# Patient Record
Sex: Male | Born: 1990 | Race: White | Hispanic: No | Marital: Single | Smoking: Never smoker
Health system: Southern US, Community
[De-identification: ages and names within clinical notes are randomized; demographics above are authoritative.]

## PROBLEM LIST (undated history)

## (undated) DIAGNOSIS — F909 Attention-deficit hyperactivity disorder, unspecified type: Secondary | ICD-10-CM

## (undated) HISTORY — PX: ANKLE SURGERY: SHX546

---
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
## 2012-05-07 ENCOUNTER — Other Ambulatory Visit: Payer: Self-pay | Admitting: Family Medicine

## 2012-05-07 DIAGNOSIS — N498 Inflammatory disorders of other specified male genital organs: Secondary | ICD-10-CM

## 2012-06-11 ENCOUNTER — Other Ambulatory Visit: Payer: Self-pay

## 2012-09-08 ENCOUNTER — Ambulatory Visit
Admission: RE | Admit: 2012-09-08 | Discharge: 2012-09-08 | Disposition: A | Discharge status: Home / Self Care | Payer: 59 | Source: Ambulatory Visit | Attending: Family Medicine | Admitting: Family Medicine

## 2012-09-08 DIAGNOSIS — N498 Inflammatory disorders of other specified male genital organs: Secondary | ICD-10-CM

## 2013-08-02 IMAGING — US US SCROTUM
1 series · 14 of 25 positions shown · non-contrast
Comparison: None.

CLINICAL DATA: Extratesticular nodule felt in the right scrotum.

ULTRASOUND OF SCROTUM
TECHNIQUE: Complete ultrasound examination of the testicles,
epididymis, and other scrotal structures was performed.

[Series 1: us scrotum · 0.08mm/px · 14 of 40 slices shown]
[im 1/40]
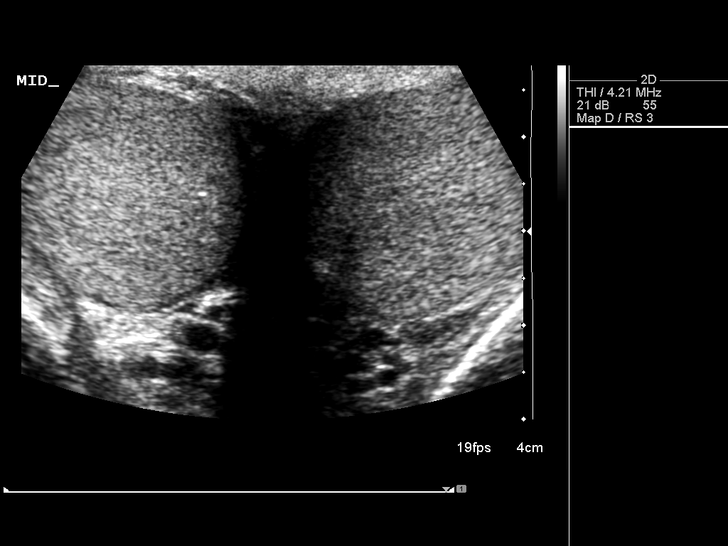
[im 4/40]
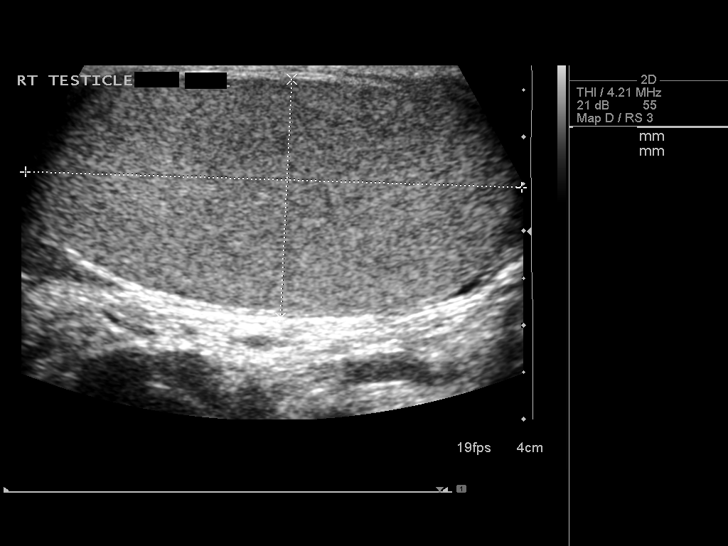
[im 7/40]
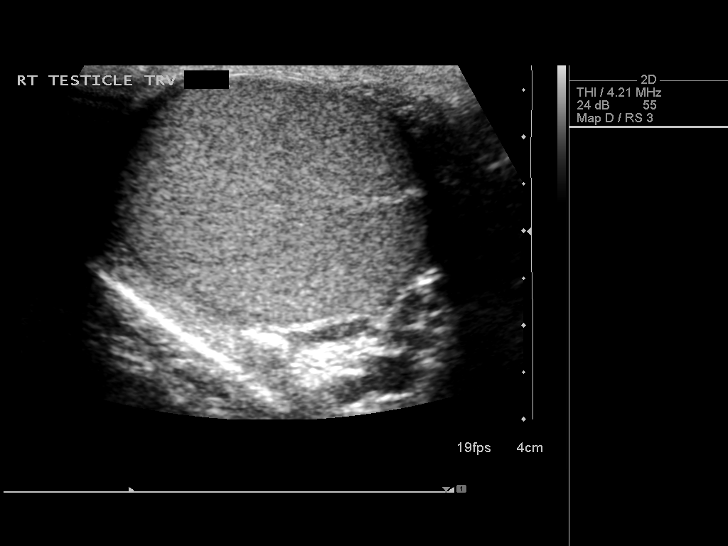
[im 10/40]
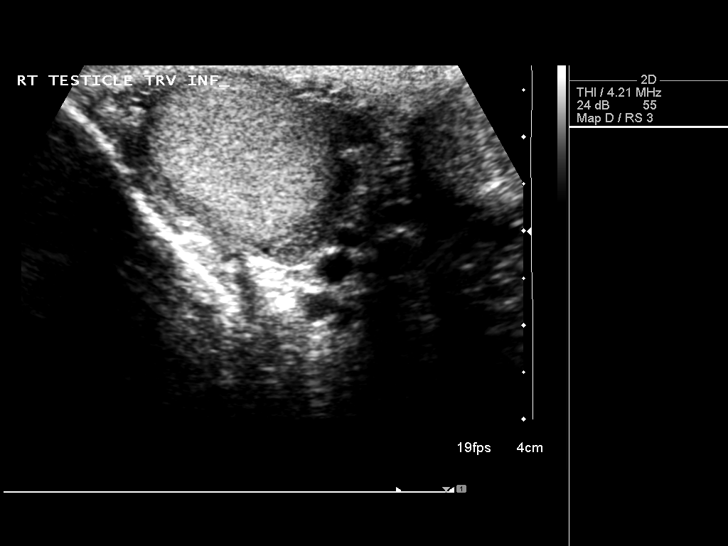
[im 14/40]
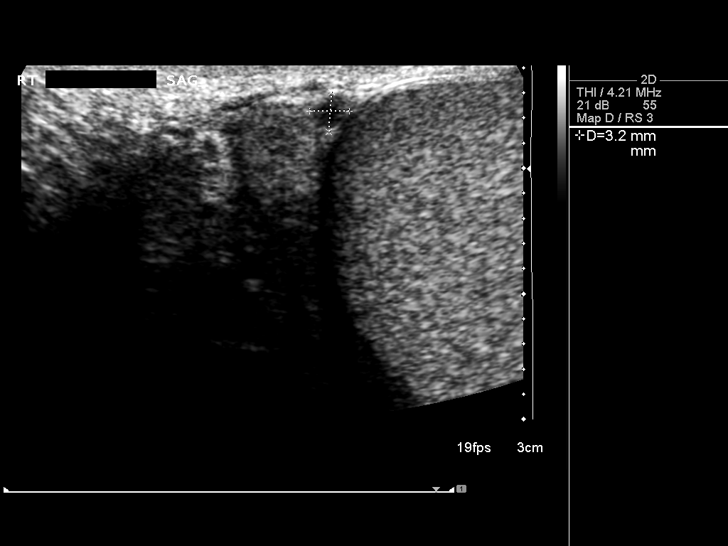
[im 15/40]
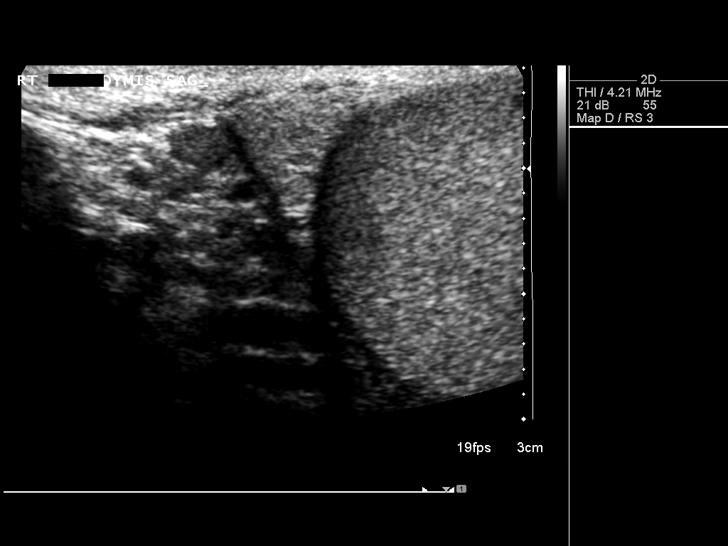
[im 18/40]
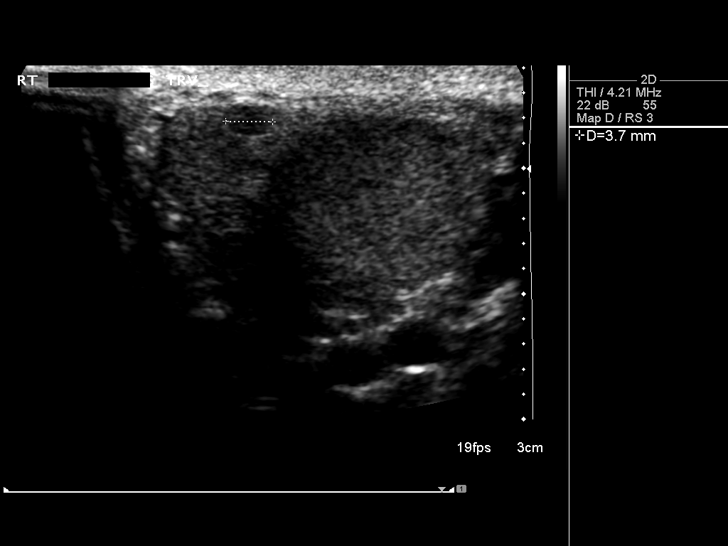
[im 22/40]
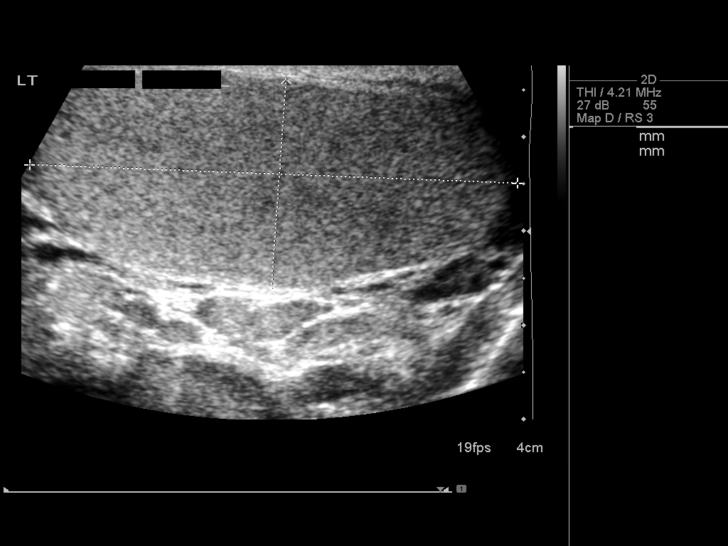
[im 25/40]
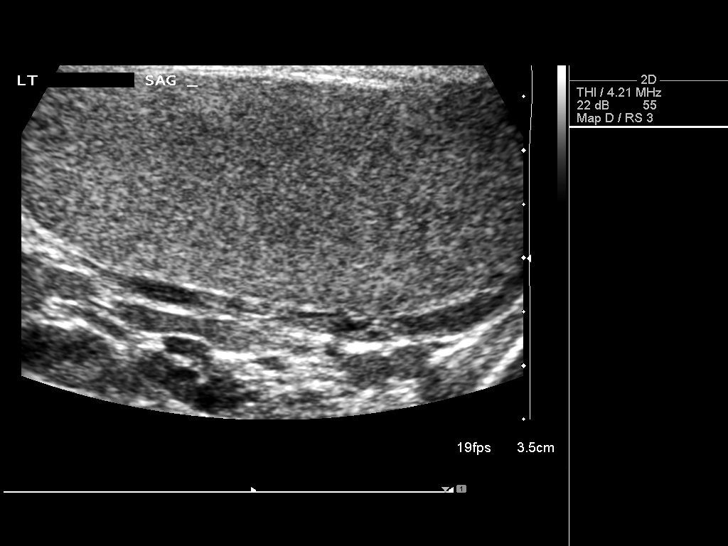
[im 27/40]
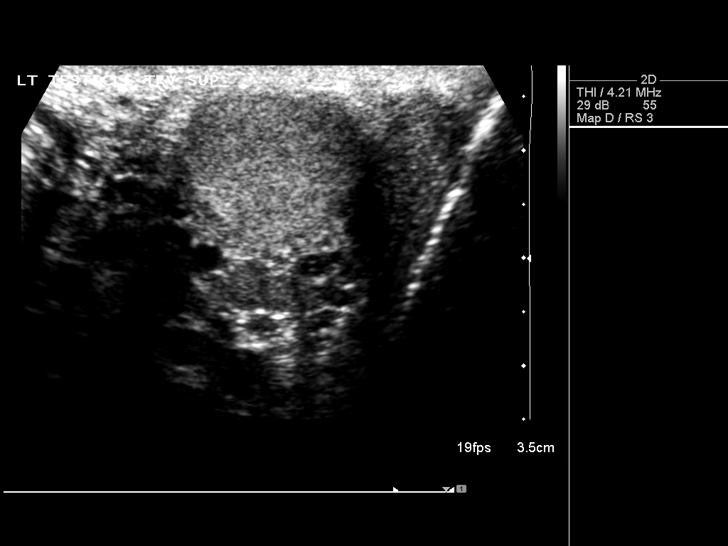
[im 30/40]
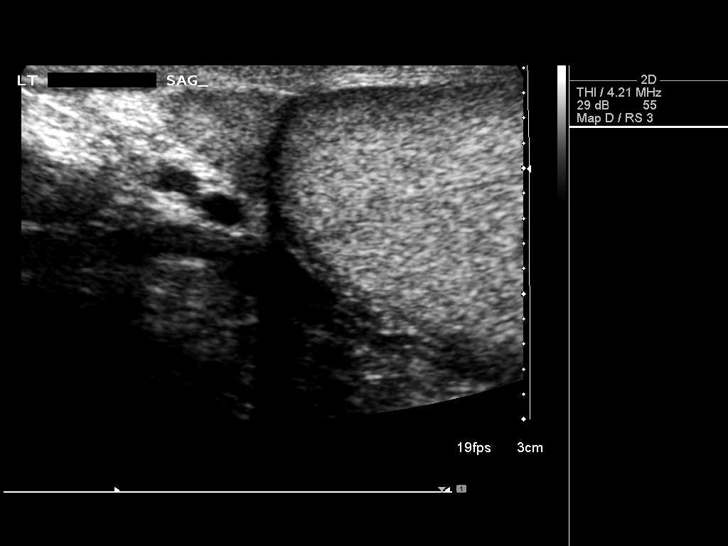
[im 33/40]
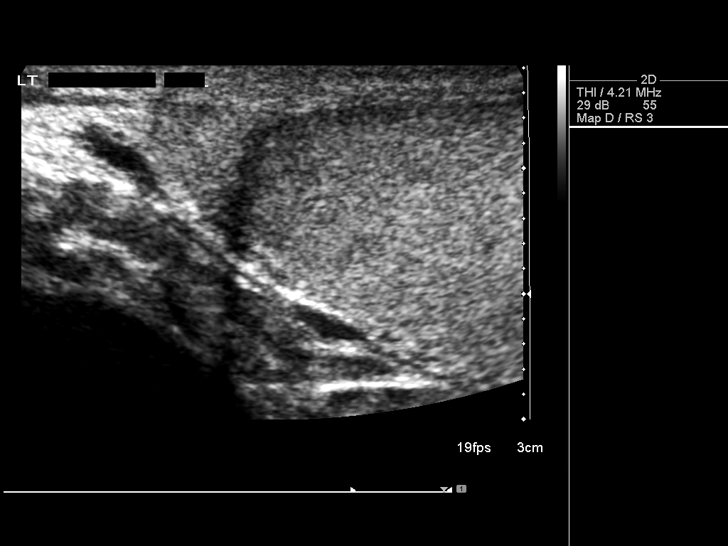
[im 36/40]
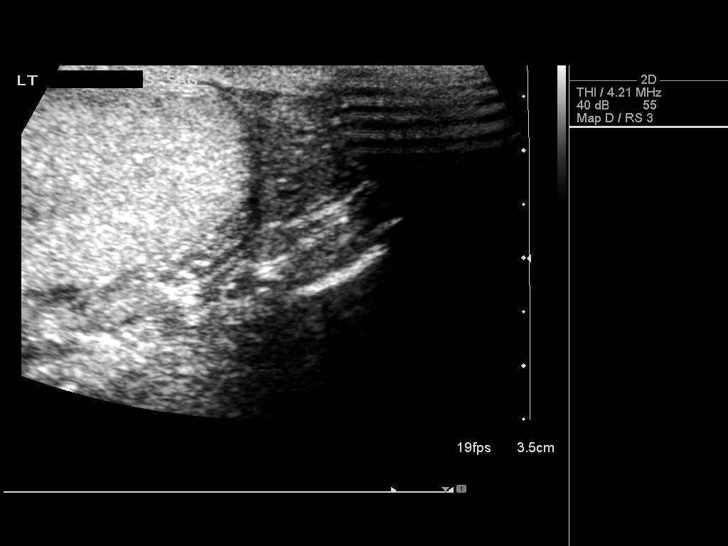
[im 40/40]
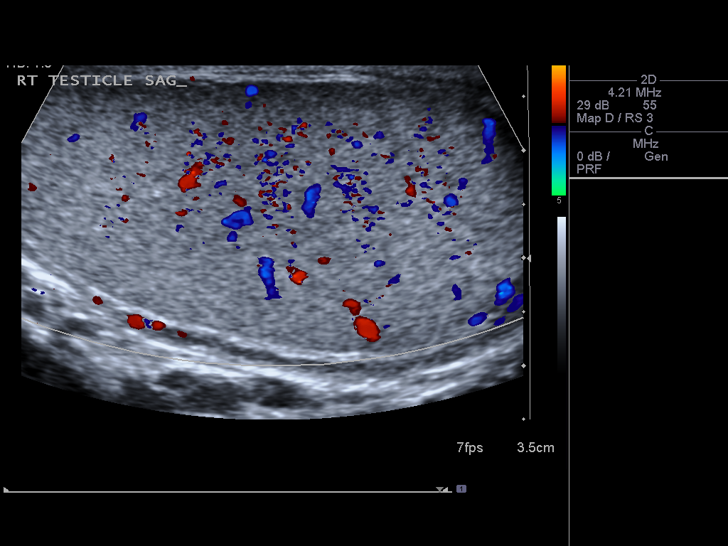

[14 of 25 positions shown; findings below may reference images not displayed]

FINDINGS: Right testis:  5.3 x 2.5 x 3.4 cm.  Homogeneous echotexture with
normal blood flow on color Doppler imaging.  No intratesticular
mass lesion.

Left testis:  5.2 x 2.3 x 3.1 cm.  Also with normal ultrasound
features.

Right epididymis:  Normal size and configuration.  Tiny 4 mm
epididymal cyst or spermatocele evident.

Left epididymis:  Normal in size and appearance.

Hydrocele:  Absent.

Varicocele:  Absent.
IMPRESSION: Tiny 4 mm right epididymal cyst or spermatocele.  Otherwise normal
exam.

## 2015-09-17 ENCOUNTER — Ambulatory Visit
Admission: EM | Admit: 2015-09-17 | Discharge: 2015-09-17 | Disposition: A | Discharge status: Home / Self Care | Payer: 59 | Attending: Family Medicine | Admitting: Family Medicine

## 2015-09-17 DIAGNOSIS — H6592 Unspecified nonsuppurative otitis media, left ear: Secondary | ICD-10-CM

## 2015-09-17 DIAGNOSIS — H93232 Hyperacusis, left ear: Secondary | ICD-10-CM

## 2015-09-17 MED ORDER — AMOXICILLIN 875 MG PO TABS: 875.0000 mg | ORAL_TABLET | Freq: Two times a day (BID) | ORAL | Status: AC

## 2015-09-17 NOTE — ED Provider Notes (Signed)
CSN: [id number]     Arrival date & time 09/17/15  [id number] History   First MD Initiated Contact with Patient 09/17/15 1100     Chief Complaint  Patient presents with  . Hearing Loss   (Consider location/radiation/quality/duration/timing/severity/associated sxs/prior Treatment) HPI Comments: 25 yo male with a 1 week h/o left sided hearing loss. Patient reports fullness feeling of left ear. States about 4 months ago was at a fair around loud noises and for 3 days after that had ringing in the left ear which resolved. About 1 week ago was cleaning ear canal with a Q tip and felt sudden pain. Since then has noticed the fullness feeling and hearing loss. Denies any fevers, chills, drainage.   The history is provided by the patient.    No past medical history on file. No past surgical history on file. No family history on file. Social History  Substance Use Topics  . Smoking status: Never Smoker   . Smokeless tobacco: None  . Alcohol Use: No    Review of Systems  Allergies  Review of patient's allergies indicates no known allergies.  Home Medications   Prior to Admission medications   Medication Sig Start Date End Date Taking? Authorizing Provider  amphetamine-dextroamphetamine (ADDERALL) 20 MG tablet Take 20 mg by mouth 2 (two) times daily.   Yes Historical Provider, MD  amoxicillin (AMOXIL) 875 MG tablet Take 1 tablet (875 mg total) by mouth 2 (two) times daily. 09/17/15   Payton Mccallumrlando Tesslyn Baumert, MD   Meds Ordered and Administered this Visit  Medications - No data to display  BP 111/71 mmHg  Pulse 66  Temp(Src) 97.4 F (36.3 C) (Oral)  Resp 16  Ht 5\' 11"  (1.803 m)  Wt 175 lb (79.379 kg)  BMI 24.42 kg/m2  SpO2 100% No data found.   Physical Exam  Constitutional: He appears well-developed and well-nourished. No distress.  HENT:  Head: Normocephalic and atraumatic.  Right Ear: Tympanic membrane, external ear and ear canal normal.  Left Ear: External ear and ear canal normal. A  middle ear effusion is present.  Nose: Nose normal.  Mouth/Throat: Uvula is midline, oropharynx is clear and moist and mucous membranes are normal. No oropharyngeal exudate or tonsillar abscesses.  Eyes: Conjunctivae and EOM are normal. Pupils are equal, round, and reactive to light. Right eye exhibits no discharge. Left eye exhibits no discharge. No scleral icterus.  Neck: Normal range of motion. Neck supple. No tracheal deviation present. No thyromegaly present.  Cardiovascular: Normal rate, regular rhythm and normal heart sounds.   Pulmonary/Chest: Effort normal and breath sounds normal. No stridor. No respiratory distress. He has no wheezes. He has no rales. He exhibits no tenderness.  Lymphadenopathy:    He has no cervical adenopathy.  Neurological: He is alert.  Skin: Skin is warm and dry. No rash noted. He is not diaphoretic.  Nursing note and vitals reviewed.   ED Course  Procedures (including critical care time)  Labs Review Labs Reviewed - No data to display  Imaging Review No results found.   Visual Acuity Review  Right Eye Distance:   Left Eye Distance:   Bilateral Distance:    Right Eye Near:   Left Eye Near:    Bilateral Near:         MDM   1. Left serous otitis media, recurrence not specified, unspecified chronicity   2. Hearing abnormally acute, left    Discharge Medication List as of 09/17/2015 11:33 AM    START taking  these medications   Details  amoxicillin (AMOXIL) 875 MG tablet Take 1 tablet (875 mg total) by mouth 2 (two) times daily., Starting 09/17/2015, Until Discontinued, Normal       1. diagnosis reviewed with patient 2. rx as per orders above; reviewed possible side effects, interactions, risks and benefits  3. Follow-up with ENT if no improvement after treatment or sooner prn if symptoms worsen or don't improve    Payton Mccallum, MD 09/18/15 [phone]

## 2015-09-17 NOTE — ED Notes (Signed)
Sudden since this morning. Pt reports it caused him to miss his alarm. Reports sounds are muffled. Has had tinnitus for 3 months. Also cleaned his ear about 1 week ago with a Q Tip and possibly damaged his TM.

## 2017-10-06 ENCOUNTER — Other Ambulatory Visit: Payer: Self-pay

## 2017-10-06 ENCOUNTER — Emergency Department (HOSPITAL_BASED_OUTPATIENT_CLINIC_OR_DEPARTMENT_OTHER)
Admission: EM | Admit: 2017-10-06 | Discharge: 2017-10-06 | Disposition: A | Discharge status: Home / Self Care | Payer: 59 | Attending: Emergency Medicine | Admitting: Emergency Medicine

## 2017-10-06 ENCOUNTER — Encounter (HOSPITAL_BASED_OUTPATIENT_CLINIC_OR_DEPARTMENT_OTHER): Payer: Self-pay | Admitting: *Deleted

## 2017-10-06 DIAGNOSIS — Y929 Unspecified place or not applicable: Secondary | ICD-10-CM | POA: Insufficient documentation

## 2017-10-06 DIAGNOSIS — S0101XA Laceration without foreign body of scalp, initial encounter: Secondary | ICD-10-CM | POA: Insufficient documentation

## 2017-10-06 DIAGNOSIS — Z79899 Other long term (current) drug therapy: Secondary | ICD-10-CM | POA: Insufficient documentation

## 2017-10-06 DIAGNOSIS — Y998 Other external cause status: Secondary | ICD-10-CM | POA: Insufficient documentation

## 2017-10-06 DIAGNOSIS — W19XXXA Unspecified fall, initial encounter: Secondary | ICD-10-CM

## 2017-10-06 DIAGNOSIS — W01198A Fall on same level from slipping, tripping and stumbling with subsequent striking against other object, initial encounter: Secondary | ICD-10-CM | POA: Insufficient documentation

## 2017-10-06 DIAGNOSIS — Y9301 Activity, walking, marching and hiking: Secondary | ICD-10-CM | POA: Insufficient documentation

## 2017-10-06 HISTORY — DX: Attention-deficit hyperactivity disorder, unspecified type: F90.9

## 2017-10-06 MED ORDER — ACETAMINOPHEN 325 MG PO TABS
650.0000 mg | ORAL_TABLET | Freq: Once | ORAL | Status: AC
  Administered 2017-10-06: 650 mg via ORAL
  Filled 2017-10-06: qty 2

## 2017-10-06 NOTE — Discharge Instructions (Signed)
Please see the information and instructions below regarding your visit.  Your diagnoses today include:  1. Laceration of scalp without foreign body, initial encounter   2. Fall from standing, initial encounter    Concussions are caused by acceleration/deceleration forces of the brain against the skull and in mild forms, cannot be seen on any imaging. The injury occurs at the microscopic level, and causes a disturbance more in function than the structure of the brain itself. Common symptoms of concussion include: ?Poor coordination such as stumbling or inability to walk in a straight line ?Vacant stare (befuddled facial expression) ?Delayed verbal expression (slower to answer questions or follow instructions) ?Inability to focus attention (easily distracted and unable to follow through with normal activities) ?Disorientation (walking in the wrong direction, unaware of time, date, place) ?Slurred or incoherent speech (making disjointed or incomprehensible statements) ?Emotionality out of proportion to circumstances (appearing distraught, crying for no apparent reason) ?Memory deficits (exhibited by patient repeatedly asking the same question that has already been answered or inability to recall three of three words after five minutes)  Tests performed today include:  See side panel of your discharge paperwork for testing performed today. Vital signs are listed at the bottom of these instructions.   Medications prescribed:    Avoid aspirin. Take only ibuprofen (Advil) or naproxen (Aleve), and acetaminophen (Tylenol).  Take any prescribed medications only as prescribed, and any over the counter medications only as directed on the packaging.  Home care instructions:  Please follow any educational materials contained in this packet.    Do not take tranquilizers, sedatives, narcotics or alcohol  Use ice packs for comfort  For the first 24 hours, do not soak the head.  You may shower after 24  hours, but be careful not to scrub the area where the staples are in place.  Follow-up instructions: Please follow-up with your primary care provider for further evaluation of your symptoms if they are not completely improved.   Please follow-up with this department or urgent care facility in 7 days for staple removal.  Return instructions:  Please return to the Emergency Department if you experience worsening symptoms.   If any of the following occur notify your physician or go to the Hospital Emergency Department:  Increased drowsiness, confusion, or loss of consciousness  Restlessness or convulsions (fits)  Paralysis in arms or legs  Temperature above 100 F  Vomiting  Severe headache  Blood or clear fluid dripping from the nose or ears  Stiffness of the neck  Dizziness or blurred vision  Pulsating pain in the eye  Unequal pupils of eye  Personality changes  Any other unusual symptoms  Please return if you have any other emergent concerns.   Please return the emergency department if you notice any drainage is green or yellow out of the wound, or redness surrounding the wound.  Additional Information:   Your vital signs today were: BP 118/68 (BP Location: Right Arm)    Pulse 78    Temp 98.2 F (36.8 C) (Oral)    Resp 18    Ht 5\' 11"  (1.803 m)    Wt 74.8 kg (165 lb)    SpO2 98%    BMI 23.01 kg/m  If your blood pressure (BP) was elevated on multiple readings during this visit above 130 for the top number or above 80 for the bottom number, please have this repeated by your primary care provider within one month. --------------  Thank you for allowing [place] to  participate in your care today. It was my pleasure to care for you!

## 2017-10-06 NOTE — ED Provider Notes (Signed)
[facility] EMERGENCY DEPARTMENT Provider Note   CSN: [id number] Arrival date & time: 10/06/17  1259     History   Chief Complaint Chief Complaint  Patient presents with  . Head Injury    HPI Rodney Lambert is a 27 y.o. male.  HPI  Patient is a 27 year old male with a history of ADHD presenting for a mechanical fall from standing resulting in head laceration approximately 4 hours prior to presentation.  Patient reports he was on the phone with his girlfriend and tripped over some backpack straps, falling backwards and hitting an [place].  Patient denies loss of consciousness before or after the head injury, and remained on the phone with his girlfriend the entire time.  Patient reports he had a brief moment of disorientation, however did not have any retrograde amnesia, difficulty speaking, or changes in mentation after the incident.  Patient denies vomiting.  Patient does not take any blood thinning medications.  Patient reports his pain as a dull ache at present, but has no other collect at this time.  Patient observed for 6 hours after incident without change.  Patient reports his tetanus shot was less than 2015.  Past Medical History:  Diagnosis Date  . ADHD     There are no active problems to display for this patient.   Past Surgical History:  Procedure Laterality Date  . ANKLE SURGERY         Home Medications    Prior to Admission medications   Medication Sig Start Date End Date Taking? Authorizing Provider  amphetamine-dextroamphetamine (ADDERALL) 20 MG tablet Take 20 mg by mouth 2 (two) times daily.   Yes [provider]  amoxicillin (AMOXIL) 875 MG tablet Take 1 tablet (875 mg total) by mouth 2 (two) times daily. 09/17/15   Payton Mccallum, MD    Family History No family history on file.  Social History Social History   Tobacco Use  . Smoking status: Never Smoker  . Smokeless tobacco: Never Used  Substance Use Topics  . Alcohol use: No    . Drug use: No     Allergies   Patient has no known allergies.   Review of Systems Review of Systems  HENT: Negative for ear discharge.   Gastrointestinal: Negative for nausea and vomiting.  Skin: Positive for wound.  Neurological: Negative for seizures, syncope, weakness, numbness and headaches.     Physical Exam Updated Vital Signs BP 118/68 (BP Location: Right Arm)   Pulse 78   Temp 98.2 F (36.8 C) (Oral)   Resp 18   Ht 5\' 11"  (1.803 m)   Wt 74.8 kg (165 lb)   SpO2 98%   BMI 23.01 kg/m   Physical Exam  Constitutional: He appears well-developed and well-nourished. No distress.  Sitting comfortably in bed.  HENT:  Head: Normocephalic and atraumatic.  Eyes: Conjunctivae are normal. Right eye exhibits no discharge. Left eye exhibits no discharge.  EOMs normal to gross examination.  Neck: Normal range of motion.  Cardiovascular: Normal rate and regular rhythm.  Intact, 2+ radial pulse.  Pulmonary/Chest:  Normal respiratory effort. Patient converses comfortably. No audible wheeze or stridor.  Abdominal: He exhibits no distension.  Musculoskeletal: Normal range of motion.  Neurological: He is alert.  Mental Status:  Alert, oriented, thought content appropriate, able to give a coherent history. Speech fluent without evidence of aphasia. Able to follow 2 step commands without difficulty.  Cranial Nerves:  II:  Peripheral visual fields grossly normal, pupils equal,  round, reactive to light III,IV, VI: ptosis not present, extra-ocular motions intact bilaterally  V,VII: smile symmetric, facial light touch sensation equal VIII: hearing grossly normal to voice  X: uvula elevates symmetrically  XI: bilateral shoulder shrug symmetric and strong XII: midline tongue extension without fassiculations Motor:  Normal tone. 5/5 in upper and lower extremities bilaterally including strong and equal grip strength and dorsiflexion/plantar flexion Sensory: Pinprick and light touch  normal in all extremities.  Deep Tendon Reflexes: 2+ and symmetric in the biceps and patella. No clonus. Cerebellar: normal finger-to-nose with bilateral upper extremities Gait: normal gait and balance Stance: No pronator drift and good coordination, strength, and position sense with tapping of bilateral arms (performed in sitting position).  Skin: Skin is warm and dry. He is not diaphoretic.  There is approximately 3 cm laceration not extending to the fascial layer and the right parietal occipital region.  Psychiatric: He has a normal mood and affect. His behavior is normal. Judgment and thought content normal.  Nursing note and vitals reviewed.    ED Treatments / Results  Labs (all labs ordered are listed, but only abnormal results are displayed) Labs Reviewed - No data to display  EKG  EKG Interpretation None       Radiology No results found.  Procedures .Marland KitchenLaceration Repair Date/Time: 10/06/2017 5:56 PM Performed by: Elisha Ponder, PA-C Authorized by: Elisha Ponder, PA-C   Consent:    Consent obtained:  Verbal   Consent given by:  Patient   Risks discussed:  Infection and pain Anesthesia (see MAR for exact dosages):    Anesthesia method:  None (Patient declined lidocaine infiltration.) Laceration details:    Length (cm):  3 Repair type:    Repair type:  Simple Pre-procedure details:    Preparation:  Patient was prepped and draped in usual sterile fashion Exploration:    Contaminated: no   Treatment:    Area cleansed with:  Betadine   Amount of cleaning:  Standard   Irrigation solution:  Sterile saline   Irrigation method:  Syringe Skin repair:    Repair method:  Staples   Number of staples:  4 Approximation:    Approximation:  Close   Vermilion border: well-aligned   Post-procedure details:    Dressing:  Adhesive bandage   Patient tolerance of procedure:  Tolerated well, no immediate complications   (including critical care time)  Medications  Ordered in ED Medications  acetaminophen (TYLENOL) tablet 650 mg (650 mg Oral Given 10/06/17 1644)     Initial Impression / Assessment and Plan / ED Course  I have reviewed the triage vital signs and the nursing notes.  Pertinent labs & imaging results that were available during my care of the patient were reviewed by me and considered in my medical decision making (see chart for details).    Final Clinical Impressions(s) / ED Diagnoses   Final diagnoses:  Fall from standing, initial encounter  Laceration of scalp without foreign body, initial encounter   Patient is nontoxic-appearing, neurologically intact, and in no acute distress.  Patient cleared per [place] head CT rules.  No neck tenderness to warrant neck imaging.  Patient was observed 6 hours after the incident without change.  Patient deemed a low risk head injury, as he is not on blood thinning medications and he is a young healthy male with a fall from standing.  I discussed with patient reasons to return after head injury.  Posterior scalp laceration closed with 4 staples.  Patient  tolerated procedure well.  Return precautions were given for any erythema or drainage that is nonbloody from the wound.  Patient given return precautions to follow-up in 7 days for staple removal.  Patient is in understanding and agrees with the plan of care.  ED Discharge Orders    None       Delia ChimesMurray, Alyssa B, PA-C 10/06/17 Nicola [id number]    Haviland, Julie, MD 10/06/17 1925

## 2017-10-06 NOTE — ED Triage Notes (Signed)
Pt reports he tripped and fell backwards hitting his head on a table. Was on phone with his girlfriend at the time who reports he talked to her the whole time. Pt reports being dazed but not losing consciousness. Lac present to posterior scalp. Bleeding controlled
